# Patient Record
Sex: Female | Born: 1995
Health system: Southern US, Community
[De-identification: ages and names within clinical notes are randomized; demographics above are authoritative.]

## PROBLEM LIST (undated history)

## (undated) DIAGNOSIS — F419 Anxiety disorder, unspecified: Secondary | ICD-10-CM

## (undated) DIAGNOSIS — F32A Depression, unspecified: Secondary | ICD-10-CM

## (undated) DIAGNOSIS — F329 Major depressive disorder, single episode, unspecified: Secondary | ICD-10-CM

## (undated) HISTORY — PX: WISDOM TOOTH EXTRACTION: SHX21

## (undated) HISTORY — DX: Anxiety disorder, unspecified: F41.9

## (undated) HISTORY — DX: Depression, unspecified: F32.A

## (undated) HISTORY — DX: Major depressive disorder, single episode, unspecified: F32.9

## (undated) HISTORY — PX: TONSILLECTOMY AND ADENOIDECTOMY: SUR1326

---
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
## 2016-01-07 ENCOUNTER — Ambulatory Visit (HOSPITAL_COMMUNITY): Payer: Self-pay | Admitting: Psychology

## 2016-01-19 ENCOUNTER — Ambulatory Visit (INDEPENDENT_AMBULATORY_CARE_PROVIDER_SITE_OTHER): Payer: 59 | Admitting: Psychology

## 2016-01-19 DIAGNOSIS — F321 Major depressive disorder, single episode, moderate: Secondary | ICD-10-CM | POA: Diagnosis not present

## 2016-01-19 NOTE — Progress Notes (Signed)
   THERAPIST PROGRESS NOTE  Session Time: 9.02am-9.47am  Participation Level: Active  Behavioral Response: Well GroomedAlertaffect congruent  Type of Therapy: Individual Therapy  Treatment Goals addressed: Diagnosis: MDD and goal 1.  Interventions: CBT and Supportive  Summary: Sophia RushHailey A Demeo is a 20 y.o. female who presents with report of not feeling well w/ a head cold.  Pt reported that she plans to go home and sleep today.  Pt reported that she does feel overwhelmed w/ school.  Pt reports that is taking a lot of her energy to keep up w/ workload but she is getting things accomplished.  Pt reported that she does feel lonely w/ not making many friends as not staying on campus and that she doesn't get to see many of her previous friends. Pt discussed that she also is emotional support to many friends and takes on this emotional stress. Pt acknowledges that she tries to "fix" things she cannot. Pt was able to reframe w/ counselor assistance and discuss how still important for her self care although a "helper" to others. Pt identified things she needs to say no to.  Pt expressed felt good to express today and ways of setting boundaries.    Suicidal/Homicidal: Nowithout intent/plan  Therapist Response: Assessed pt current functioning per pt report.  Processed w/pt her transition to college and challenges she is facing.  Discussed w/ pt need for self care and time for engaging socially as well.  Explored w/pt her taking on friends problems and importance of setting boundaries and reframing when guilt that not doing enough.    Plan: Return again in 2 weeks.  Diagnosis: MDD, moderate    Elver Stadler, [name] 01/19/2016

## 2016-02-09 ENCOUNTER — Ambulatory Visit (INDEPENDENT_AMBULATORY_CARE_PROVIDER_SITE_OTHER): Payer: 59 | Admitting: Psychology

## 2016-02-09 DIAGNOSIS — F411 Generalized anxiety disorder: Secondary | ICD-10-CM | POA: Diagnosis not present

## 2016-02-09 DIAGNOSIS — F321 Major depressive disorder, single episode, moderate: Secondary | ICD-10-CM

## 2016-02-09 NOTE — Progress Notes (Signed)
   THERAPIST PROGRESS NOTE  Session Time: 9am-9.50am  Participation Level: Active  Behavioral Response: Well GroomedAlertDepressed  Type of Therapy: Individual Therapy  Treatment Goals addressed: Diagnosis: MDD and goal 1.  Interventions: CBT and Other: Thought record sheet  Summary: Delicia A Fanny Bienurcola is a 20 y.o. female who presents with report of depressed mood that is severe about 1 time a week.  Pt reported that she is managing her anxiety well.  Pt reported that she is doing well in school- As and Bs in classes and getting to class.  Pt reports that when gets into depressed mood hard to break thought process and will have vague and fleeting thoughts about life not mattering.  Pt denies any active SI or any intent or plan.  Pt was able to explore incident last week and increased awareness of several minor things going wrong- feeling frustrated, overwhelmed and then sad when thoughts of she is a mess up.  Pt was able to challenge this thought w/ facts and acknowledge that mistakes and things not going her way don't equal being a "screw up".  Pt receptive to idea of journal and using thought record sheets to assist in challenging distorted thinking. Pt also able to recognize that she has made progress w/ depression as now longer lays in bed for 3 days when depressed.  Suicidal/Homicidal: Nowithout intent/plan  Therapist Response: Assessed pt current functioning per pt report. Processed w/pt her report of depression and assisted pt in exploring distorted thinking and ways of challenging w/ facts.  Introduced pt to use of thought record to assist w/ this on her own and to bring back to session.      Plan: Return again in 2 weeks.  Diagnosis: MDD, GAD    YATES,[name], [name] 02/09/2016

## 2016-03-01 ENCOUNTER — Ambulatory Visit (HOSPITAL_COMMUNITY): Payer: Self-pay | Admitting: Psychiatry

## 2016-03-01 ENCOUNTER — Ambulatory Visit (HOSPITAL_COMMUNITY): Payer: Self-pay | Admitting: Psychology

## 2016-03-08 ENCOUNTER — Ambulatory Visit (HOSPITAL_COMMUNITY): Payer: Self-pay | Admitting: Psychiatry

## 2016-03-15 ENCOUNTER — Ambulatory Visit (HOSPITAL_COMMUNITY): Payer: Self-pay | Admitting: Psychology

## 2016-03-15 NOTE — Progress Notes (Deleted)
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 School at [employer] is starting next week and pt is nervous.    HPI: Pt reports she has stress induced panic attacks about once a month. Reports she can feel them coming on and deal with it.  Anxiety is a problem she continues to struggle with. She feels anxious about several times a a week for several hours. It is worse due to school starting next week. She can usually talk herself down. Pt is confident that symptoms will improve after school starts. Denies HA, insomnia, body aches and GI upset.   Depression is ongoing. Pt gets down a few times a week for a day at all time and is worse when anxiety is up. She is easily overwhelmed and sensitive. Reports some worthlessness. It is related to family stressors especially her relationship with her mother. Sleep is good. Denies SI/HI.   Taking meds as prescribed and denies SE.    Visit Diagnosis:  No diagnosis found.  Past Psychiatric History:  Diagnosis: Panic disorder  Hospitalizations: none  Outpatient Care: None   Substance Abuse Care: none  Self-Mutilation: none  Suicidal Attempts: none  Violent Behaviors: none  Previous meds: Prozac Past Medical History: No past medical history on file. History of Loss of Consciousness: No Seizure History: No Cardiac History: No       Past Medical History:  Past Medical History:  Diagnosis Date  . Anxiety   . Depression     Past Surgical History:  Procedure Laterality Date  . TONSILLECTOMY AND ADENOIDECTOMY     age 89  . TONSILLECTOMY AND ADENOIDECTOMY  AGE 69  . WISDOM TOOTH EXTRACTION      Family Psychiatric and Medical History:  Family History  Problem Relation Age of Onset  . Anxiety disorder Mother   . Anxiety disorder Sister     Social History:  Current Place of Residence: lives with parents  and 3 siblings. Graduated HS. Going [facility] [employer] in fall 2017 Social History   Social History  . Marital status: Married    Spouse name: N/A  . Number of children: N/A  . Years of education: N/A   Social History Main Topics  . Smoking status: Never Smoker  . Smokeless tobacco: Never Used  . Alcohol use Yes     Comment: occ- once a week  . Drug use:     Frequency: 1.0 time per week    Types: Marijuana     Comment:  a few times a week  . Sexual activity: Yes    Birth control/ protection: Pill   Other Topics Concern  . Not on file   Social History Narrative  . No narrative on file    Allergies: No Known Allergies  Metabolic Disorder Labs: No results found for: HGBA1C, MPG No results found for: PROLACTIN No results found for: CHOL, TRIG, HDL, CHOLHDL, VLDL, LDLCALC   Current Medications: Current Outpatient Prescriptions  Medication Sig Dispense Refill  . citalopram (CELEXA) 40 MG tablet Take 1 tablet (40 mg total) by mouth daily. 30 tablet 3  . LORYNA 3-0.02 MG tablet Take 1 tablet by mouth daily.  4   No current facility-administered medications for this visit.      Musculoskeletal: Strength & Muscle Tone: within normal limits Gait & Station: normal Patient leans: straight  Psychiatric Specialty  Exam: Review of Systems  Constitutional: Negative for chills, fever, malaise/fatigue and weight loss.  HENT: Negative for ear pain and sore throat.   Eyes: Negative for blurred vision, double vision and pain.  Respiratory: Negative for cough, shortness of breath and wheezing.   Cardiovascular: Negative for chest pain, palpitations and leg swelling.  Gastrointestinal: Negative for abdominal pain, heartburn, nausea and vomiting.  Musculoskeletal: Negative for back pain, joint pain and neck pain.  Skin: Negative for itching and rash.  Neurological: Negative for dizziness, tingling, tremors, seizures, loss of consciousness and headaches.  Psychiatric/Behavioral: Positive for  depression. Negative for substance abuse and suicidal ideas. The patient is nervous/anxious. The patient does not have insomnia.     There were no vitals taken for this visit.There is no height or weight on file to calculate BMI.  General Appearance: Casual  Eye Contact:  Good  Speech:  Clear and Coherent and Normal Rate  Volume:  Normal  Mood:  Anxious and Depressed  Affect:  Congruent  Thought Process:  Goal Directed  Orientation:  Full (Time, Place, and Person)  Thought Content: Logical   Suicidal Thoughts:  No  Homicidal Thoughts:  No  Memory:  Immediate;   Fair Recent;   Fair Remote;   Fair  Judgement:  Fair  Insight:  Fair  Psychomotor Activity:  Normal  Concentration:  Concentration: Good and Attention Span: Good  Recall:  Good  Fund of Knowledge: Good  Language: Good  Akathisia:  No  Handed:  Right  AIMS (if indicated):  n/a  Assets:  Communication Skills Desire for Improvement Housing Resilience Talents/Skills Transportation  ADL's:  Intact  Cognition: WNL  Sleep:  good   Assessment Sophia [name] is a 20 year old female with MDD, GAD, panic disorder who is transferred from Dr. Minda MeoAg   Plan  The patient demonstrates the following risk factors for suicide: Chronic risk factors for suicide include: {Chronic Risk Factors for WJXBJYN:82956213}Suicide:30414011}. Acute risk factors for suicide include: {Acute Risk Factors for YQMVHQI:69629528}Suicide:30414012}. Protective factors for this patient include: {Protective Factors for Suicide UXLK:44010272}Risk:30414013}. Considering these factors, the overall suicide risk at this point appears to be {Desc; low/moderate/high:110033}. Patient {ACTION; IS/IS ZDG:[id number]}OT:21021397} appropriate for outpatient follow up.   Treatment Plan Summary:Medication management and Plan see below  Assessment:  Major depression single episode moderate.  Generalized anxiety disorder. Panic disorder.   Medication management with supportive therapy. Risks/benefits and SE of the medication  discussed. Pt verbalized understanding and verbal consent obtained for treatment.  Affirm with the patient that the medications are taken as ordered. Patient expressed understanding of how their medications were to be used.  Meds:Increase Celexa to 40mg  po qD for mood and anxiety   Labs: Hb 11, glu 101- recommended pt try Fe tabs  Therapy: brief supportive therapy provided. Discussed psychosocial stressors in detail.   Encouraged pt to develop daily routine and work on daily goal setting as a way to improve mood symptoms.    Consultations:  Encouraged to continue individual therapy  Pt denies SI and is at an acute low risk for suicide. Patient told to call clinic if any problems occur. Patient advised to go to ER if they should develop SI/HI, side effects, or if symptoms worsen. Has crisis numbers to call if needed. Pt verbalized understanding.  F/up in 3 months or sooner if needed   Neysa Hottereina Mechell Girgis, MD 03/15/2016, 4:06 PM

## 2016-03-17 ENCOUNTER — Ambulatory Visit (HOSPITAL_COMMUNITY): Payer: Self-pay | Admitting: Psychiatry

## 2016-04-03 ENCOUNTER — Other Ambulatory Visit (HOSPITAL_COMMUNITY): Payer: Self-pay | Admitting: Psychiatry

## 2016-04-03 DIAGNOSIS — F321 Major depressive disorder, single episode, moderate: Secondary | ICD-10-CM

## 2016-04-03 DIAGNOSIS — F411 Generalized anxiety disorder: Secondary | ICD-10-CM

## 2016-04-03 DIAGNOSIS — F410 Panic disorder [episodic paroxysmal anxiety] without agoraphobia: Secondary | ICD-10-CM

## 2016-05-27 ENCOUNTER — Encounter (HOSPITAL_COMMUNITY): Payer: Self-pay | Admitting: Psychology

## 2016-06-16 ENCOUNTER — Encounter (HOSPITAL_COMMUNITY): Payer: Self-pay | Admitting: Psychology

## 2016-06-16 NOTE — Progress Notes (Signed)
Sophia [name] is a 21 y.o. female patient discharged from counseling as last seen on 02/09/16.  Outpatient Therapist Discharge Summary  Sophia [name]    [birth year]   Admission Date: 06/05/15   Discharge Date:  06/16/16 Reason for Discharge:  notactive Diagnosis:  MDD, moderate  Comments:  Return if needed.  Sophia [name]          [name],[name], [name]

## 2016-07-22 DIAGNOSIS — R399 Unspecified symptoms and signs involving the genitourinary system: Secondary | ICD-10-CM | POA: Diagnosis not present

## 2016-07-22 DIAGNOSIS — N390 Urinary tract infection, site not specified: Secondary | ICD-10-CM | POA: Diagnosis not present

## 2016-07-22 DIAGNOSIS — N926 Irregular menstruation, unspecified: Secondary | ICD-10-CM | POA: Diagnosis not present

## 2016-09-27 DIAGNOSIS — Z0000 Encounter for general adult medical examination without abnormal findings: Secondary | ICD-10-CM | POA: Diagnosis not present

## 2016-10-06 DIAGNOSIS — Z01419 Encounter for gynecological examination (general) (routine) without abnormal findings: Secondary | ICD-10-CM | POA: Diagnosis not present

## 2016-10-06 DIAGNOSIS — Z6820 Body mass index (BMI) 20.0-20.9, adult: Secondary | ICD-10-CM | POA: Diagnosis not present

## 2016-11-04 DIAGNOSIS — N72 Inflammatory disease of cervix uteri: Secondary | ICD-10-CM | POA: Diagnosis not present

## 2016-11-24 DIAGNOSIS — H5213 Myopia, bilateral: Secondary | ICD-10-CM | POA: Diagnosis not present

## 2016-11-29 DIAGNOSIS — R590 Localized enlarged lymph nodes: Secondary | ICD-10-CM | POA: Diagnosis not present

## 2017-09-01 DIAGNOSIS — H109 Unspecified conjunctivitis: Secondary | ICD-10-CM | POA: Diagnosis not present

## 2017-10-06 DIAGNOSIS — R0989 Other specified symptoms and signs involving the circulatory and respiratory systems: Secondary | ICD-10-CM | POA: Diagnosis not present

## 2017-10-19 DIAGNOSIS — Z01419 Encounter for gynecological examination (general) (routine) without abnormal findings: Secondary | ICD-10-CM | POA: Diagnosis not present

## 2017-11-01 DIAGNOSIS — R21 Rash and other nonspecific skin eruption: Secondary | ICD-10-CM | POA: Diagnosis not present

## 2017-11-25 DIAGNOSIS — L710 Perioral dermatitis: Secondary | ICD-10-CM | POA: Diagnosis not present

## 2017-11-29 DIAGNOSIS — H52203 Unspecified astigmatism, bilateral: Secondary | ICD-10-CM | POA: Diagnosis not present

## 2017-11-29 DIAGNOSIS — H5213 Myopia, bilateral: Secondary | ICD-10-CM | POA: Diagnosis not present

## 2018-04-11 DIAGNOSIS — L309 Dermatitis, unspecified: Secondary | ICD-10-CM | POA: Diagnosis not present

## 2018-04-11 DIAGNOSIS — Z23 Encounter for immunization: Secondary | ICD-10-CM | POA: Diagnosis not present

## 2018-08-10 DIAGNOSIS — R350 Frequency of micturition: Secondary | ICD-10-CM | POA: Diagnosis not present

## 2018-08-10 DIAGNOSIS — Z124 Encounter for screening for malignant neoplasm of cervix: Secondary | ICD-10-CM | POA: Diagnosis not present

## 2019-11-30 ENCOUNTER — Ambulatory Visit
Admission: RE | Admit: 2019-11-30 | Discharge: 2019-11-30 | Disposition: A | Discharge status: Home / Self Care | Payer: 59 | Source: Ambulatory Visit | Attending: Physician Assistant | Admitting: Physician Assistant

## 2019-11-30 ENCOUNTER — Other Ambulatory Visit: Payer: Self-pay | Admitting: Physician Assistant

## 2019-11-30 DIAGNOSIS — T1490XA Injury, unspecified, initial encounter: Secondary | ICD-10-CM

## 2019-12-04 ENCOUNTER — Encounter: Payer: Self-pay | Admitting: Orthopaedic Surgery

## 2019-12-04 ENCOUNTER — Ambulatory Visit: Payer: 59 | Admitting: Orthopaedic Surgery

## 2019-12-04 DIAGNOSIS — S92355A Nondisplaced fracture of fifth metatarsal bone, left foot, initial encounter for closed fracture: Secondary | ICD-10-CM | POA: Diagnosis not present

## 2019-12-04 DIAGNOSIS — S92309A Fracture of unspecified metatarsal bone(s), unspecified foot, initial encounter for closed fracture: Secondary | ICD-10-CM | POA: Insufficient documentation

## 2019-12-04 NOTE — Progress Notes (Signed)
Office Visit Note   Patient: Sophia [name]           Date of Birth: 04/09/1996           MRN: [id number] Visit Date: 12/04/2019              Requested by: Berline Lopes, MD [address] [place],  [place] [address] PCP: Berline Lopes, MD   Assessment & Plan: Visit Diagnoses:  1. Closed nondisplaced fracture of fifth metatarsal bone of left foot, initial encounter     Plan: Injury approximately 12 days ago with x-rays demonstrating a nondisplaced fracture of the base of the left fifth metatarsal.  I reviewed the x-rays revealing a nondisplaced avulsion fracture.  This is not a Jones fracture.  We will place her in a short equalizer boot and have her return in 3 weeks.  No x-rays necessary unless having continued pain  Follow-Up Instructions: Return in about 3 weeks (around 12/25/2019).   Orders:  No orders of the defined types were placed in this encounter.  No orders of the defined types were placed in this encounter.     Procedures: No procedures performed   Clinical Data: No additional findings.   Subjective: Chief Complaint  Patient presents with   Left Foot - Injury, Pain    DOI 11/22/2019  Patient presents today for left foot pain. She rolled her foot on 11/22/2019 when she missed the bottom step while on vacation. She went to PCP last week and had x-rays at [facility] on 11/30/2019. She was told that she fractured her fifth metatarsal. She continues to have pain laterally, along with swelling. She is taking Ibuprofen. She is wearing a compression sleeve on her foot.  Reviewed films of her left foot on the PACS system.  There is a nondisplaced avulsion fracture at the base of the fifth metatarsal. HPI  Review of Systems  Constitutional: Positive for fatigue.  HENT: Negative for ear pain.   Eyes: Negative for pain.  Respiratory: Negative for shortness of breath.   Cardiovascular: Negative for leg swelling.  Gastrointestinal: Negative for  constipation and diarrhea.  Endocrine: Negative for cold intolerance and heat intolerance.  Genitourinary: Negative for difficulty urinating.  Musculoskeletal: Positive for joint swelling.  Skin: Negative for rash.  Allergic/Immunologic: Negative for food allergies.  Neurological: Negative for weakness.  Hematological: Does not bruise/bleed easily.  Psychiatric/Behavioral: Negative for sleep disturbance.     Objective: Vital Signs: Ht 5\' 5"  (1.651 m)    Wt 162 lb (73.5 kg)    LMP 11/17/2019    BMI 26.96 kg/m   Physical Exam Constitutional:      Appearance: She is well-developed.  Eyes:     Pupils: Pupils are equal, round, and reactive to light.  Pulmonary:     Effort: Pulmonary effort is normal.  Skin:    General: Skin is warm and dry.  Neurological:     Mental Status: She is alert and oriented to person, place, and time.  Psychiatric:        Behavior: Behavior normal.     Ortho Exam awake alert and oriented x3.  Comfortable sitting.  Left foot with local tenderness over the base of the fifth metatarsal.  No deformity.  Mild swelling of the foot.  Normal sensation.  Normal motor exam  Specialty Comments:  No specialty comments available.  Imaging: No results found.   PMFS History: Patient Active Problem List   Diagnosis Date Noted  Metatarsal bone fracture 12/04/2019   GAD (generalized anxiety disorder) 06/16/2015   Major depressive disorder, single episode, moderate (HCC) 04/07/2013   Panic disorder 04/05/2013   Past Medical History:  Diagnosis Date   Anxiety    Depression     Family History  Problem Relation Age of Onset   Anxiety disorder Mother    Anxiety disorder Sister     Past Surgical History:  Procedure Laterality Date   TONSILLECTOMY AND ADENOIDECTOMY     age 54   TONSILLECTOMY AND ADENOIDECTOMY  AGE 32   WISDOM TOOTH EXTRACTION     Social History   Occupational History   Not on file  Tobacco Use   Smoking status: Never  Smoker   Smokeless tobacco: Never Used  Substance and Sexual Activity   Alcohol use: Yes    Comment: occ- once a week   Drug use: Yes    Frequency: 1.0 times per week    Types: Marijuana    Comment:  a few times a week   Sexual activity: Yes    Birth control/protection: Pill

## 2019-12-25 ENCOUNTER — Encounter: Payer: Self-pay | Admitting: Orthopaedic Surgery

## 2019-12-25 ENCOUNTER — Ambulatory Visit: Payer: 59 | Admitting: Orthopaedic Surgery

## 2019-12-25 VITALS — Ht 65.0 in | Wt 162.0 lb

## 2019-12-25 DIAGNOSIS — S92355D Nondisplaced fracture of fifth metatarsal bone, left foot, subsequent encounter for fracture with routine healing: Secondary | ICD-10-CM

## 2019-12-25 NOTE — Progress Notes (Signed)
   Office Visit Note   Patient: Sophia [name]           Date of Birth: Jan 14, 1996           MRN: [id number] Visit Date: 12/25/2019              Requested by: Berline Lopes, MD [address] [place],  [place] [address] PCP: Berline Lopes, MD   Assessment & Plan: Visit Diagnoses:  1. Closed nondisplaced fracture of fifth metatarsal bone of left foot with routine healing, subsequent encounter     Plan: 5-week status post injury to left foot and 3 weeks postop last office visit.  Has an avulsion fracture-nondisplaced-base of the fifth metatarsal.  Totally asymptomatic.  Long discussion regarding activity modification over the next 10 to 14 days and then resumed her normal activity.  No need for follow-up  evaluation or films  Follow-Up Instructions: Return if symptoms worsen or fail to improve.   Orders:  No orders of the defined types were placed in this encounter.  No orders of the defined types were placed in this encounter.     Procedures: No procedures performed   Clinical Data: No additional findings.   Subjective: Chief Complaint  Patient presents with  . Left Foot - Follow-up    5th metatarsal fracture  Patient presents today for a three week follow up on her left foot. She sustained a fifth metatarsal fracture about five weeks ago. She is doing well. She states that she stopped wearing the equalizer boot a few days and has not experienced any pain.   HPI  Review of Systems   Objective: Vital Signs: Ht 5\' 5"  (1.651 m)   Wt 162 lb (73.5 kg)   BMI 26.96 kg/m   Physical Exam  Ortho Exam walks without a limp.  No pain over the base of the left fifth metatarsal.  No deformity.  Neurologically intact  Specialty Comments:  No specialty comments available.  Imaging: No results found.   PMFS History: Patient Active Problem List   Diagnosis Date Noted  . Metatarsal bone fracture 12/04/2019  . GAD (generalized anxiety disorder) 06/16/2015   . Major depressive disorder, single episode, moderate (HCC) 04/07/2013  . Panic disorder 04/05/2013   Past Medical History:  Diagnosis Date  . Anxiety   . Depression     Family History  Problem Relation Age of Onset  . Anxiety disorder Mother   . Anxiety disorder Sister     Past Surgical History:  Procedure Laterality Date  . TONSILLECTOMY AND ADENOIDECTOMY     age 24  . TONSILLECTOMY AND ADENOIDECTOMY  AGE 24  . WISDOM TOOTH EXTRACTION     Social History   Occupational History  . Not on file  Tobacco Use  . Smoking status: Never Smoker  . Smokeless tobacco: Never Used  Substance and Sexual Activity  . Alcohol use: Yes    Comment: occ- once a week  . Drug use: Yes    Frequency: 1.0 times per week    Types: Marijuana    Comment:  a few times a week  . Sexual activity: Yes    Birth control/protection: Pill

## 2021-01-07 IMAGING — DX DG FOOT COMPLETE 3+V*L*
3 series · 3 of 3 positions shown · non-contrast
Comparison: None.

CLINICAL DATA: 23-year-old female with a history of ankle injury

EXAM:
LEFT FOOT - COMPLETE 3+ VIEW

[dg foot complete left (1 of 3)]
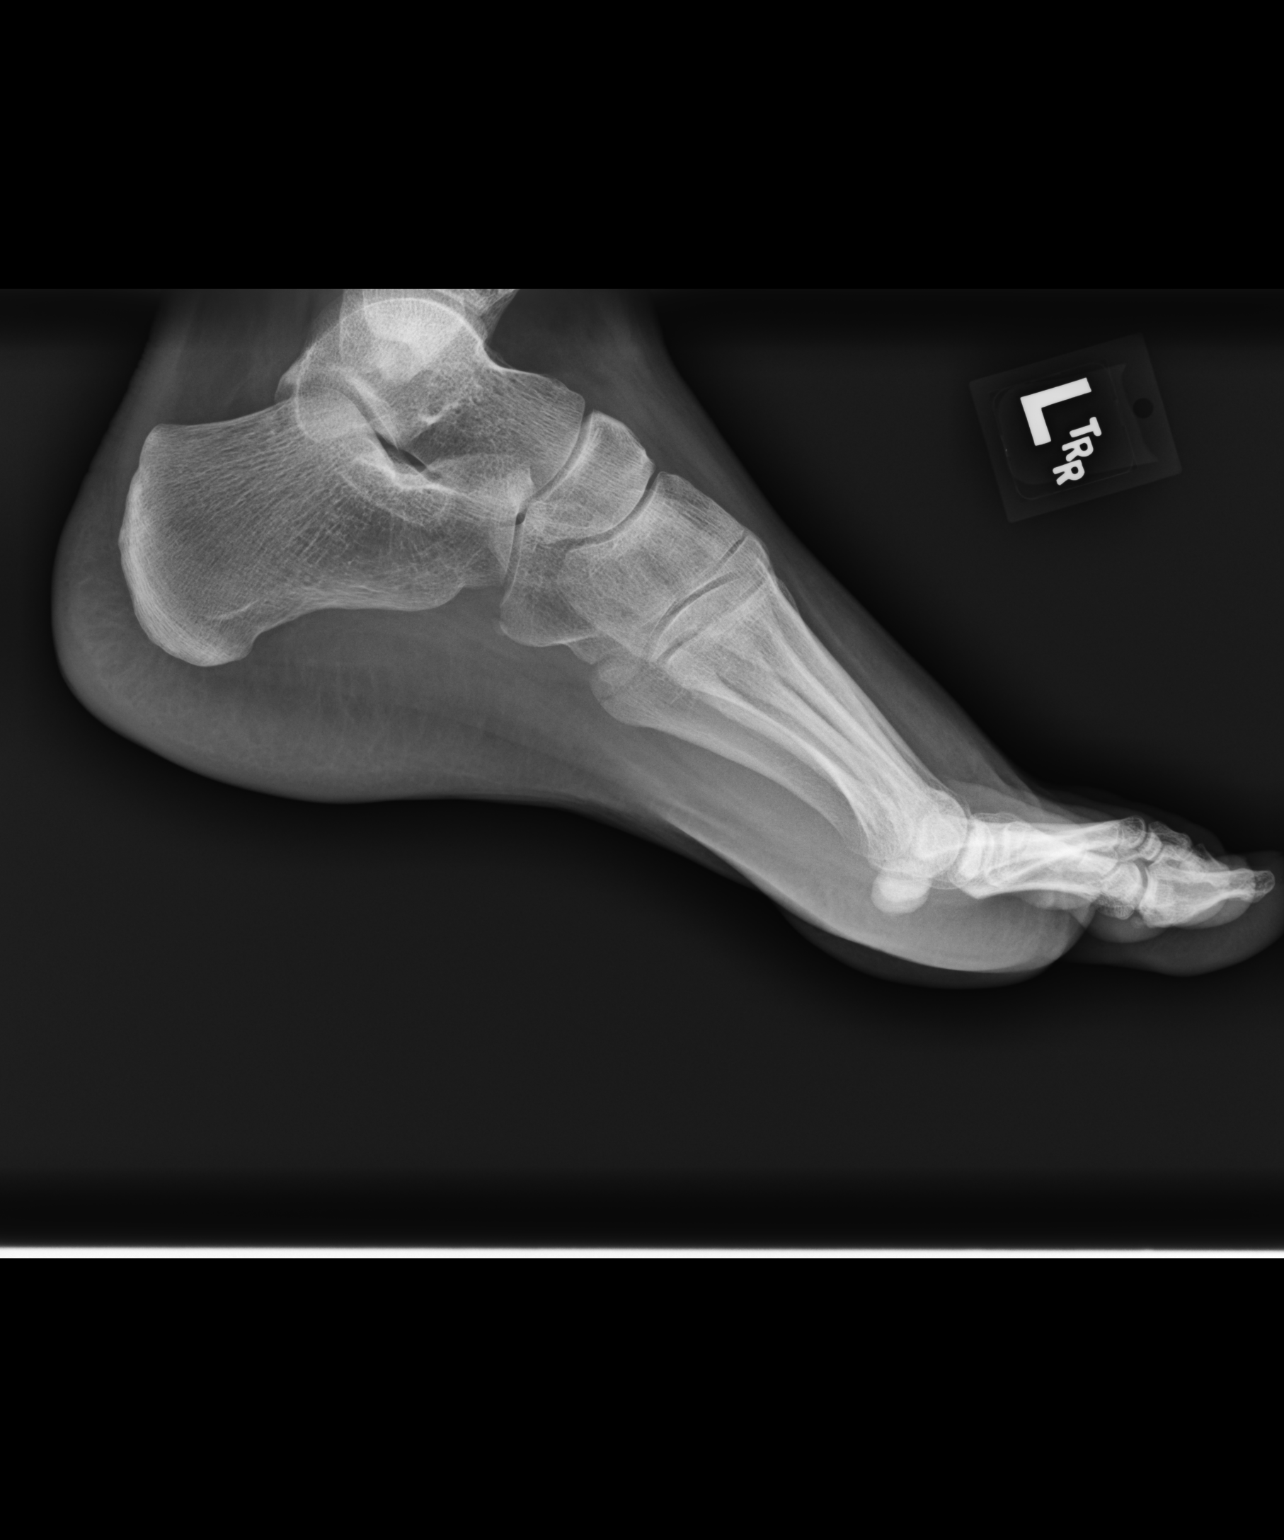

[dg foot complete left (2 of 3)]
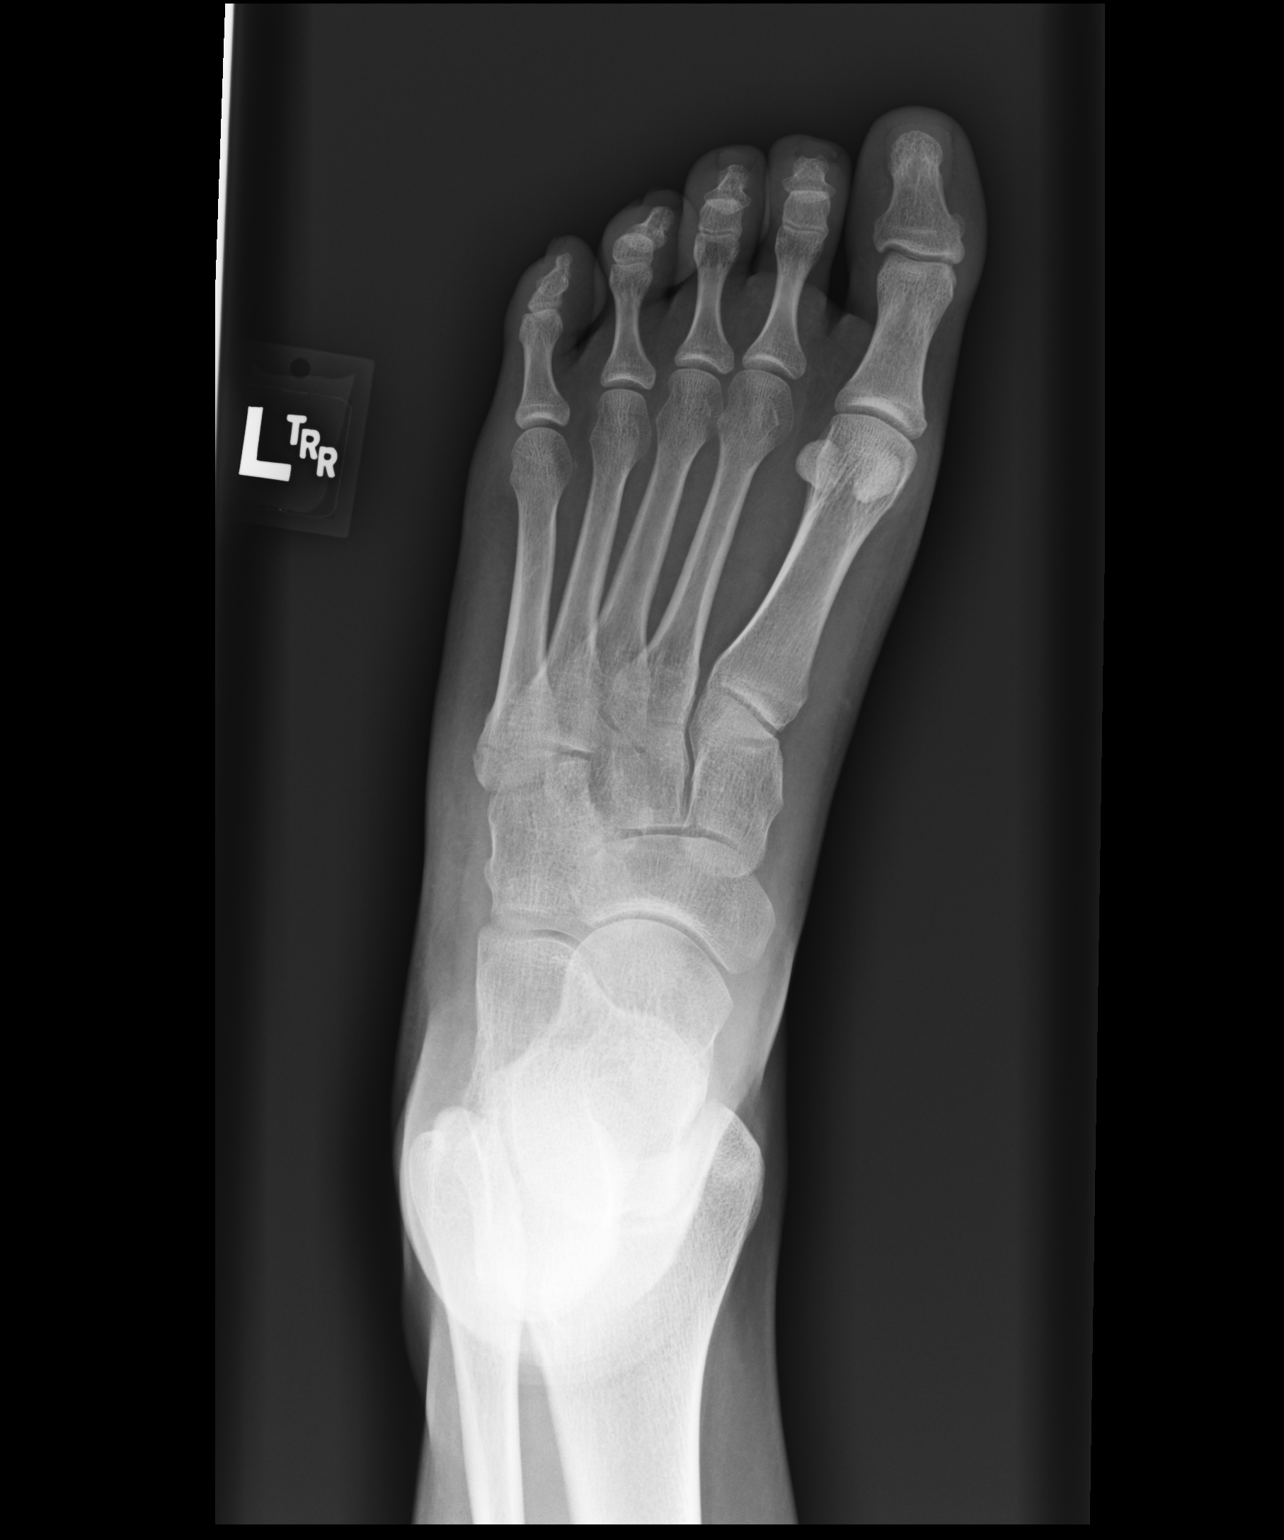

[dg foot complete left (3 of 3)]
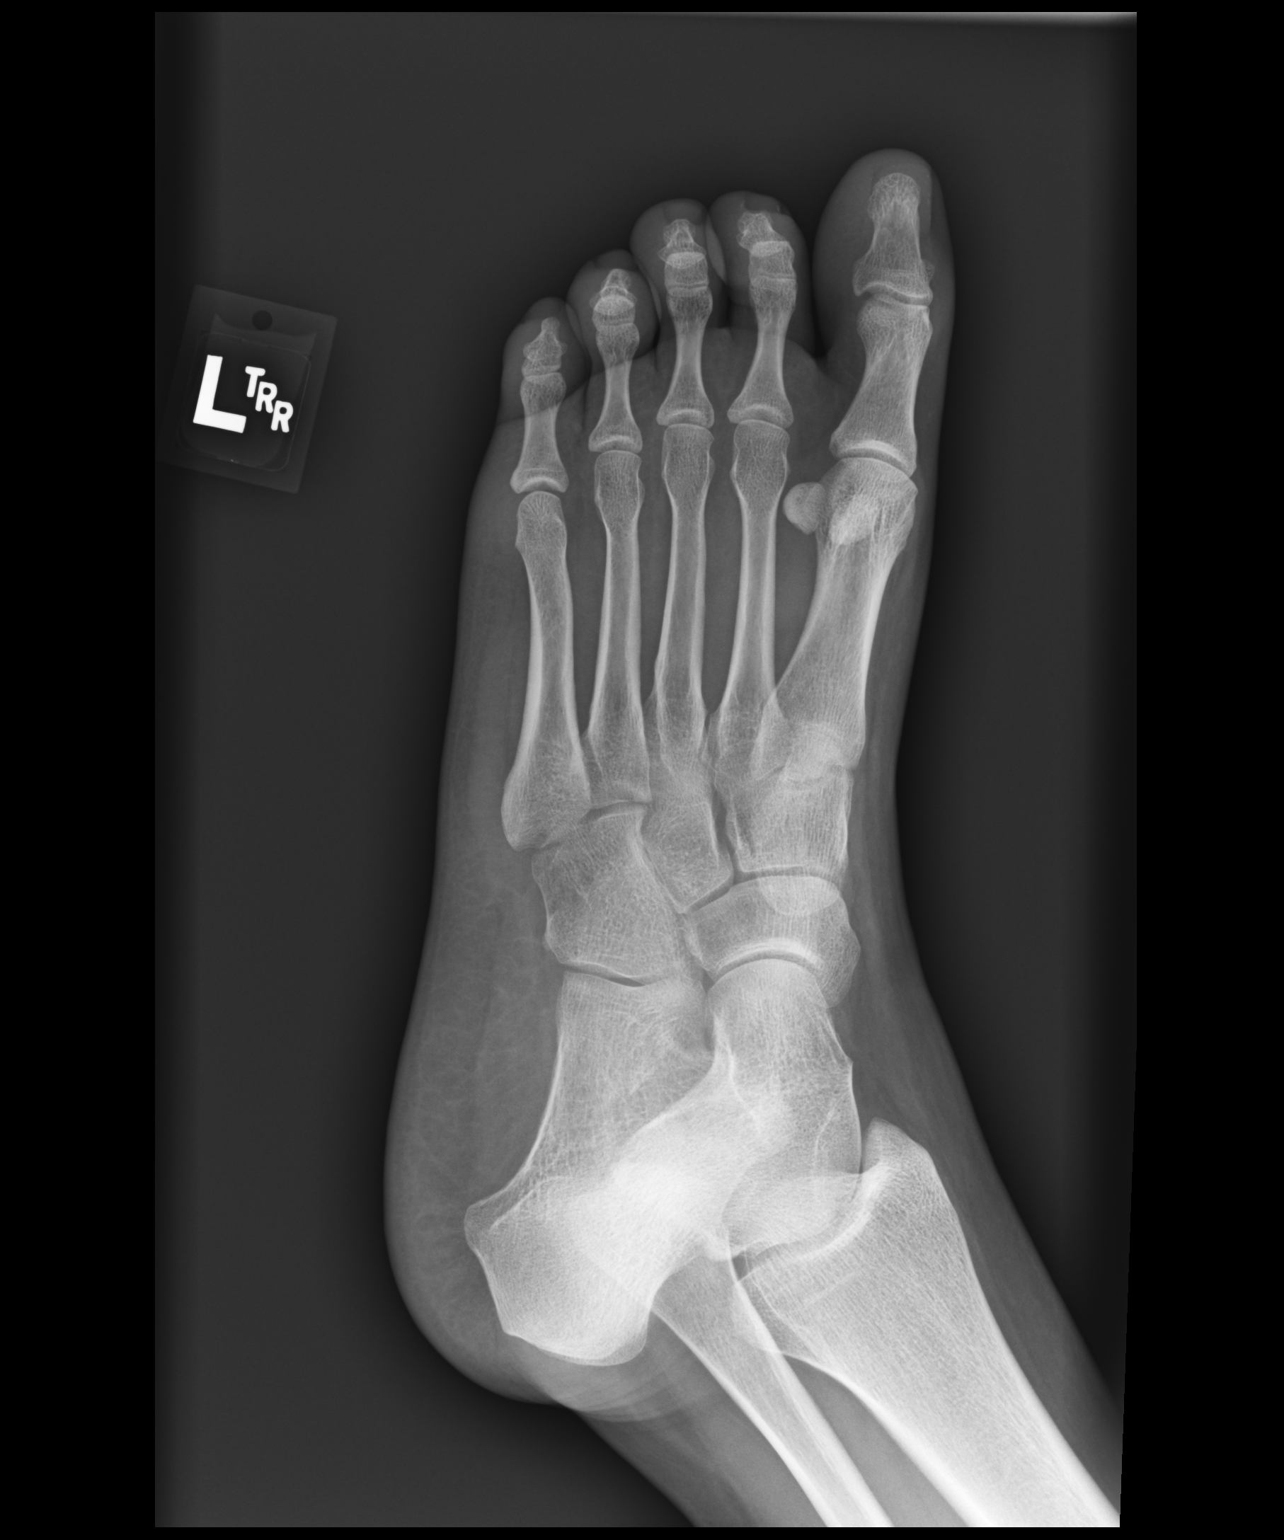

[3 of 3 positions shown; findings below may reference images not displayed]

FINDINGS: Fracture at the base of the fifth metatarsal, nondisplaced. No
radiopaque foreign body. No subluxation-dislocation.
IMPRESSION: Nondisplaced fracture base of the fifth metatarsal.

## 2023-01-11 DIAGNOSIS — L0292 Furuncle, unspecified: Secondary | ICD-10-CM | POA: Diagnosis not present

## 2023-02-09 DIAGNOSIS — F411 Generalized anxiety disorder: Secondary | ICD-10-CM | POA: Diagnosis not present

## 2023-02-09 DIAGNOSIS — F332 Major depressive disorder, recurrent severe without psychotic features: Secondary | ICD-10-CM | POA: Diagnosis not present

## 2023-02-16 DIAGNOSIS — Z13220 Encounter for screening for lipoid disorders: Secondary | ICD-10-CM | POA: Diagnosis not present

## 2023-02-16 DIAGNOSIS — L02214 Cutaneous abscess of groin: Secondary | ICD-10-CM | POA: Diagnosis not present

## 2023-02-16 DIAGNOSIS — Z131 Encounter for screening for diabetes mellitus: Secondary | ICD-10-CM | POA: Diagnosis not present

## 2023-02-16 DIAGNOSIS — Z0000 Encounter for general adult medical examination without abnormal findings: Secondary | ICD-10-CM | POA: Diagnosis not present

## 2023-02-16 DIAGNOSIS — Z23 Encounter for immunization: Secondary | ICD-10-CM | POA: Diagnosis not present

## 2023-05-04 DIAGNOSIS — F411 Generalized anxiety disorder: Secondary | ICD-10-CM | POA: Diagnosis not present

## 2023-05-04 DIAGNOSIS — F332 Major depressive disorder, recurrent severe without psychotic features: Secondary | ICD-10-CM | POA: Diagnosis not present

## 2023-06-14 DIAGNOSIS — F411 Generalized anxiety disorder: Secondary | ICD-10-CM | POA: Diagnosis not present

## 2023-06-14 DIAGNOSIS — F332 Major depressive disorder, recurrent severe without psychotic features: Secondary | ICD-10-CM | POA: Diagnosis not present

## 2023-06-22 DIAGNOSIS — F411 Generalized anxiety disorder: Secondary | ICD-10-CM | POA: Diagnosis not present

## 2023-06-22 DIAGNOSIS — F332 Major depressive disorder, recurrent severe without psychotic features: Secondary | ICD-10-CM | POA: Diagnosis not present

## 2023-07-13 DIAGNOSIS — F332 Major depressive disorder, recurrent severe without psychotic features: Secondary | ICD-10-CM | POA: Diagnosis not present

## 2023-07-13 DIAGNOSIS — F411 Generalized anxiety disorder: Secondary | ICD-10-CM | POA: Diagnosis not present

## 2023-09-14 DIAGNOSIS — F411 Generalized anxiety disorder: Secondary | ICD-10-CM | POA: Diagnosis not present

## 2023-09-14 DIAGNOSIS — F332 Major depressive disorder, recurrent severe without psychotic features: Secondary | ICD-10-CM | POA: Diagnosis not present

## 2023-10-05 DIAGNOSIS — H52203 Unspecified astigmatism, bilateral: Secondary | ICD-10-CM | POA: Diagnosis not present

## 2023-10-05 DIAGNOSIS — H5213 Myopia, bilateral: Secondary | ICD-10-CM | POA: Diagnosis not present

## 2023-10-05 DIAGNOSIS — H16403 Unspecified corneal neovascularization, bilateral: Secondary | ICD-10-CM | POA: Diagnosis not present

## 2023-11-30 DIAGNOSIS — N898 Other specified noninflammatory disorders of vagina: Secondary | ICD-10-CM | POA: Diagnosis not present

## 2023-11-30 DIAGNOSIS — Z113 Encounter for screening for infections with a predominantly sexual mode of transmission: Secondary | ICD-10-CM | POA: Diagnosis not present

## 2023-11-30 DIAGNOSIS — N760 Acute vaginitis: Secondary | ICD-10-CM | POA: Diagnosis not present

## 2023-11-30 DIAGNOSIS — Z01419 Encounter for gynecological examination (general) (routine) without abnormal findings: Secondary | ICD-10-CM | POA: Diagnosis not present

## 2023-12-07 DIAGNOSIS — F332 Major depressive disorder, recurrent severe without psychotic features: Secondary | ICD-10-CM | POA: Diagnosis not present

## 2023-12-07 DIAGNOSIS — F411 Generalized anxiety disorder: Secondary | ICD-10-CM | POA: Diagnosis not present

## 2024-02-21 DIAGNOSIS — R300 Dysuria: Secondary | ICD-10-CM | POA: Diagnosis not present

## 2024-02-21 DIAGNOSIS — Z3202 Encounter for pregnancy test, result negative: Secondary | ICD-10-CM | POA: Diagnosis not present

## 2024-02-21 DIAGNOSIS — N3000 Acute cystitis without hematuria: Secondary | ICD-10-CM | POA: Diagnosis not present
